# Patient Record
Sex: Female | Born: 2000 | Race: White | Hispanic: No | Marital: Single | Smoking: Never smoker
Health system: Southern US, Community
[De-identification: ages and names within clinical notes are randomized; demographics above are authoritative.]

## PROBLEM LIST (undated history)

## (undated) DIAGNOSIS — F411 Generalized anxiety disorder: Secondary | ICD-10-CM

## (undated) DIAGNOSIS — F329 Major depressive disorder, single episode, unspecified: Secondary | ICD-10-CM

## (undated) DIAGNOSIS — N390 Urinary tract infection, site not specified: Secondary | ICD-10-CM

## (undated) DIAGNOSIS — F913 Oppositional defiant disorder: Secondary | ICD-10-CM

## (undated) DIAGNOSIS — F32A Depression, unspecified: Secondary | ICD-10-CM

## (undated) DIAGNOSIS — F419 Anxiety disorder, unspecified: Secondary | ICD-10-CM

## (undated) HISTORY — DX: Anxiety disorder, unspecified: F41.9

## (undated) HISTORY — DX: Depression, unspecified: F32.A

## (undated) HISTORY — DX: Urinary tract infection, site not specified: N39.0

---
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
## 2022-01-20 ENCOUNTER — Telehealth: Payer: Self-pay

## 2022-01-20 ENCOUNTER — Other Ambulatory Visit: Payer: Self-pay | Admitting: Pediatrics

## 2022-01-20 MED ORDER — METHYLPHENIDATE HCL ER (OSM) 54 MG PO TBCR: 54.0000 mg | EXTENDED_RELEASE_TABLET | Freq: Every day | ORAL | 0 refills | Status: DC

## 2022-01-20 NOTE — Telephone Encounter (Signed)
Scheduled for 5-23

## 2022-01-20 NOTE — Telephone Encounter (Signed)
Parent called asking for refill of Concerta methylphenidate 54 MG PO CR tablet [facility] [place] [facility] of [address] ?

## 2022-01-20 NOTE — Telephone Encounter (Signed)
Overdue for visit- please schedule and then I'll send ?

## 2022-01-20 NOTE — Telephone Encounter (Signed)
Meds sent

## 2022-01-22 ENCOUNTER — Other Ambulatory Visit: Payer: Self-pay | Admitting: Pediatrics

## 2022-01-22 MED ORDER — METHYLPHENIDATE HCL ER (OSM) 54 MG PO TBCR: 54.0000 mg | EXTENDED_RELEASE_TABLET | Freq: Every day | ORAL | 0 refills | Status: DC

## 2022-01-22 NOTE — Telephone Encounter (Signed)
RX was sent to wrong pharmacy. [facility] [place] [facility] of [address]

## 2022-01-22 NOTE — Telephone Encounter (Signed)
Done

## 2022-01-27 ENCOUNTER — Encounter: Payer: Self-pay | Admitting: Pediatrics

## 2022-01-27 ENCOUNTER — Ambulatory Visit: Payer: Managed Care, Other (non HMO) | Admitting: Pediatrics

## 2022-01-27 VITALS — BP 110/70 | HR 74 | Ht 64.17 in | Wt 135.8 lb

## 2022-01-27 DIAGNOSIS — R634 Abnormal weight loss: Secondary | ICD-10-CM

## 2022-01-27 DIAGNOSIS — F411 Generalized anxiety disorder: Secondary | ICD-10-CM

## 2022-01-27 DIAGNOSIS — F900 Attention-deficit hyperactivity disorder, predominantly inattentive type: Secondary | ICD-10-CM | POA: Diagnosis not present

## 2022-01-27 DIAGNOSIS — F331 Major depressive disorder, recurrent, moderate: Secondary | ICD-10-CM

## 2022-01-27 DIAGNOSIS — N926 Irregular menstruation, unspecified: Secondary | ICD-10-CM

## 2022-01-27 MED ORDER — BUPROPION HCL ER (XL) 150 MG PO TB24: ORAL_TABLET | ORAL | 1 refills | Status: DC

## 2022-01-27 MED ORDER — METHYLPHENIDATE HCL ER (OSM) 54 MG PO TBCR: 54.0000 mg | EXTENDED_RELEASE_TABLET | Freq: Every day | ORAL | 0 refills | Status: DC

## 2022-01-27 MED ORDER — SERTRALINE HCL 100 MG PO TABS: 150.0000 mg | ORAL_TABLET | Freq: Every day | ORAL | 1 refills | Status: DC

## 2022-01-27 NOTE — Progress Notes (Signed)
History was provided by the patient.  [name] is a 21 y.o. female who is here for ADHD, GAD, MDD. Irregular periods.  Leveda Anna, NP   HPI:  Pt reports things have been good but "crazy." She is graduating high school and last day is June 1st. She is going to [employer] and is on their dance team. Majoring in psych and special ed. She got great scholarships.   Meds have been going well. She is taking the concerta every day.   Weight loss since 2021. 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   24 hour recall:  B: mini wheats  D: chicken and rice  L: none  B: smoothie  Doesn't eat lunch at school but never has. No dizziness or headaches   Not seeing therapist.   Currently on period. They have been somewhat more regular.      01/27/2022    9:45 AM 05/20/2021    5:24 PM 01/02/2021   11:28 AM  PHQ-SADS Last 3 Score only  PHQ-15 Score 2  5  Total GAD-7 Score 4  5  PHQ Adolescent Score 4 7 2      Patient's last menstrual period was 01/27/2022.   Patient Active Problem List   Diagnosis Date Noted   Irregular periods 05/09/2020   Attention deficit hyperactivity disorder (ADHD), predominantly inattentive type 07/19/2018   GAD (generalized anxiety disorder) 12/09/2017   Moderate episode of recurrent major depressive disorder ([place]) 07/15/2017   Encounter for routine child health examination without abnormal findings 11/09/2016   BMI (body mass index), pediatric, 5% to less than 85% for age 52/01/2017    Current Outpatient Medications on File Prior to Visit  Medication Sig Dispense Refill   omeprazole (PRILOSEC) 40 MG capsule Take 1 capsule (40 mg total) by mouth daily. 90 capsule 3   No current facility-administered medications on file prior to visit.    Allergies  Allergen Reactions   Amoxicillin Rash    Reaction at approx 21 years old   Penicillins Rash    Social History: Confidentiality was discussed with  the patient and if applicable, with caregiver as well. Tobacco: no, though used to vape occasionally Secondhand smoke exposure? no Drugs/EtOH: none Sexually active? No; interested mostly in men  Safety: safe to self and at home  Last STI Screening: today  Pregnancy Prevention: none  Physical Exam:    Vitals:   01/27/22 0909  BP: 110/70  Pulse: 74  Weight: 135 lb 12.8 oz (61.6 kg)  Height: 5' 4.17" (1.63 m)    Blood pressure percentiles are not available for patients who are 18 years or older.  Physical Exam Vitals and nursing note reviewed.  Constitutional:      General: She is not in acute distress.    Appearance: She is well-developed.  Neck:     Thyroid: No thyromegaly.  Cardiovascular:     Rate and Rhythm: Normal rate and regular rhythm.     Heart sounds: No murmur heard. Pulmonary:     Breath sounds: Normal breath sounds.  Abdominal:     Palpations: Abdomen is soft. There is no mass.     Tenderness: There is no abdominal tenderness. There is no guarding.  Musculoskeletal:     Right lower leg: No edema.     Left lower leg: No edema.  Lymphadenopathy:     Cervical: No cervical adenopathy.  Skin:    General: Skin  is warm.     Capillary Refill: Capillary refill takes less than 2 seconds.     Findings: No rash.  Neurological:     General: No focal deficit present.     Mental Status: She is alert.     Comments: No tremor  Psychiatric:        Mood and Affect: Mood normal.    Assessment/Plan: 1. Moderate episode of recurrent major depressive disorder (HCC) Continue wellbutrin and sertraline.  - buPROPion (WELLBUTRIN XL) 150 MG 24 hr tablet; TAKE 1 TABLET(150 MG) BY MOUTH DAILY  Dispense: 90 tablet; Refill: 1  2. GAD (generalized anxiety disorder) Continue sertraline. Well controlled.  - sertraline (ZOLOFT) 100 MG tablet; Take 1.5 tablets (150 mg total) by mouth daily.  Dispense: 135 tablet; Refill: 1  3. Attention deficit hyperactivity disorder (ADHD),  predominantly inattentive type Continue concerta. She has had issues finding it. We discussed changing to something else vs. Continuing and seeing if shortage improves- she elects to continue for now.  - methylphenidate 54 MG PO CR tablet; Take 1 tablet (54 mg total) by mouth daily with breakfast.  Dispense: 30 tablet; Refill: 0 - methylphenidate 54 MG PO CR tablet; Take 1 tablet (54 mg total) by mouth daily with breakfast.  Dispense: 30 tablet; Refill: 0 - methylphenidate 54 MG PO CR tablet; Take 1 tablet (54 mg total) by mouth daily with breakfast.  Dispense: 30 tablet; Refill: 0  4. Weight loss Has had some weight loss over time. Growth chart reveals she had weight gain during covid and was not particularly active at that point. She is now much more active and on her feet a lot for her job. She has always skipped lunch at school which we discussed. Denies intentional weight loss. I do not think she needs labs today as she is back on her curve where she used to be through childhood. Will continue to monitor.   5. Irregular periods Becoming more regular. Will monitor.   Return in 3 months   Jonathon Resides, FNP

## 2022-02-27 ENCOUNTER — Telehealth: Payer: Self-pay | Admitting: *Deleted

## 2022-02-27 ENCOUNTER — Other Ambulatory Visit: Payer: Self-pay | Admitting: Pediatrics

## 2022-02-27 DIAGNOSIS — F331 Major depressive disorder, recurrent, moderate: Secondary | ICD-10-CM

## 2022-02-27 DIAGNOSIS — F411 Generalized anxiety disorder: Secondary | ICD-10-CM

## 2022-02-27 NOTE — Telephone Encounter (Signed)
Mother LVM asking for refill of Methylphenidate.  [facility] [address] [facility] [place].  States she has checked with them and it is in stock at this pharmacy.

## 2022-03-02 ENCOUNTER — Other Ambulatory Visit: Payer: Self-pay | Admitting: Pediatrics

## 2022-03-08 IMAGING — CR DG ABDOMEN 1V
1 series · 1 of 1 positions shown · non-contrast
Comparison: 08/19/2015

CLINICAL DATA: Vomiting, diarrhea

EXAM:
ABDOMEN - 1 VIEW

[t abdomen supine]
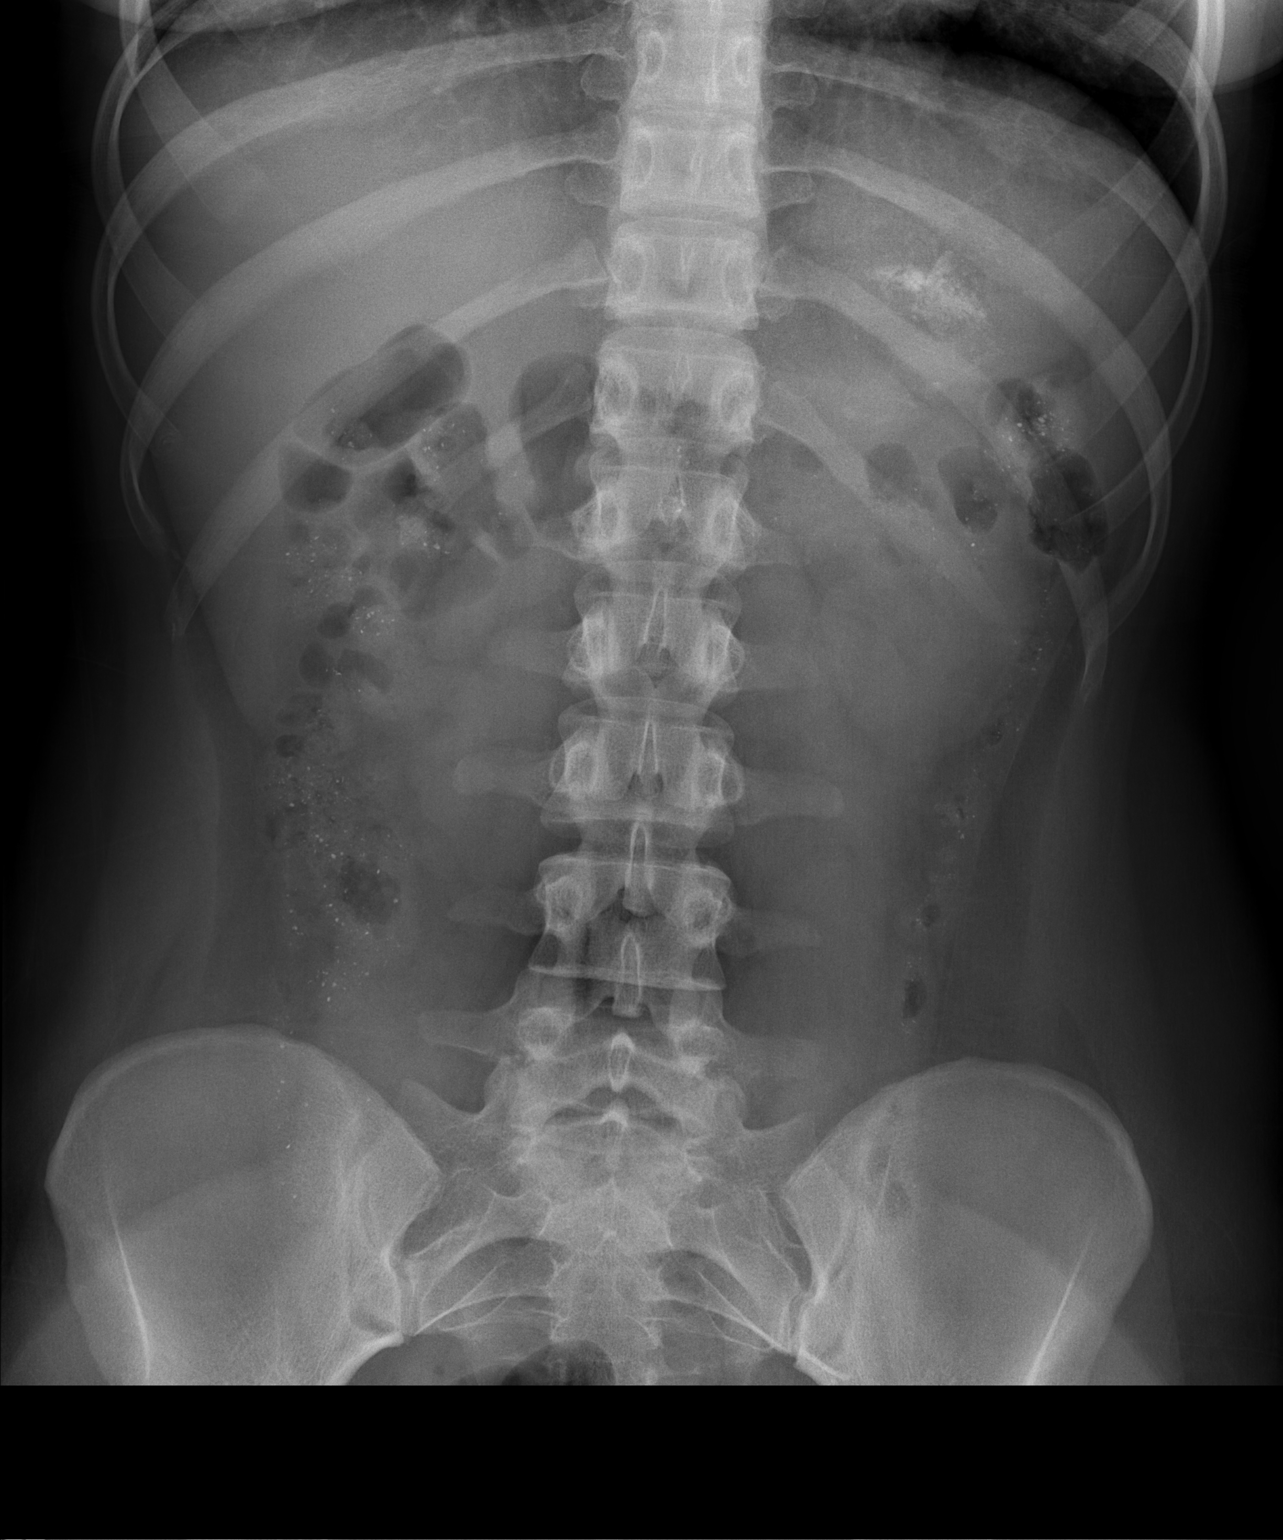

[1 of 1 positions shown; findings below may reference images not displayed]

FINDINGS: The bowel gas pattern is normal. No radio-opaque calculi or other
significant radiographic abnormality are seen.
IMPRESSION: Negative.

## 2022-04-13 ENCOUNTER — Other Ambulatory Visit: Payer: Self-pay | Admitting: Pediatrics

## 2022-04-20 ENCOUNTER — Encounter: Payer: Self-pay | Admitting: Pediatrics

## 2022-05-14 ENCOUNTER — Other Ambulatory Visit: Payer: Self-pay | Admitting: Family

## 2022-05-14 ENCOUNTER — Telehealth: Payer: Self-pay

## 2022-05-14 DIAGNOSIS — F900 Attention-deficit hyperactivity disorder, predominantly inattentive type: Secondary | ICD-10-CM

## 2022-05-14 MED ORDER — METHYLPHENIDATE HCL ER (OSM) 54 MG PO TBCR: 54.0000 mg | EXTENDED_RELEASE_TABLET | Freq: Every day | ORAL | 0 refills | Status: DC

## 2022-05-14 NOTE — Telephone Encounter (Signed)
Former pt of C. Maxwell Caul needs refill on Methylphenidate 54 mg. Pt is completely out of medication. She is now at college and requesting medication be sent to [facility] on [address] in [place], [place]. Please call mother with any questions.

## 2022-06-15 ENCOUNTER — Encounter: Payer: Self-pay | Admitting: Pediatrics

## 2022-06-15 ENCOUNTER — Ambulatory Visit (INDEPENDENT_AMBULATORY_CARE_PROVIDER_SITE_OTHER): Payer: Managed Care, Other (non HMO) | Admitting: Pediatrics

## 2022-06-15 VITALS — BP 102/80 | Ht 64.0 in | Wt 137.6 lb

## 2022-06-15 DIAGNOSIS — Z1331 Encounter for screening for depression: Secondary | ICD-10-CM | POA: Diagnosis not present

## 2022-06-15 DIAGNOSIS — F900 Attention-deficit hyperactivity disorder, predominantly inattentive type: Secondary | ICD-10-CM | POA: Diagnosis not present

## 2022-06-15 DIAGNOSIS — Z00129 Encounter for routine child health examination without abnormal findings: Secondary | ICD-10-CM

## 2022-06-15 DIAGNOSIS — Z79899 Other long term (current) drug therapy: Secondary | ICD-10-CM

## 2022-06-15 DIAGNOSIS — Z0001 Encounter for general adult medical examination with abnormal findings: Secondary | ICD-10-CM | POA: Diagnosis not present

## 2022-06-15 DIAGNOSIS — Z68.41 Body mass index (BMI) pediatric, 5th percentile to less than 85th percentile for age: Secondary | ICD-10-CM

## 2022-06-15 MED ORDER — METHYLPHENIDATE HCL ER (OSM) 54 MG PO TBCR: 54.0000 mg | EXTENDED_RELEASE_TABLET | Freq: Every day | ORAL | 0 refills | Status: DC

## 2022-06-15 MED ORDER — OMEPRAZOLE 40 MG PO CPDR: DELAYED_RELEASE_CAPSULE | ORAL | 3 refills | Status: DC

## 2022-06-15 NOTE — Patient Instructions (Signed)
At [facility] we value your feedback. You may receive a survey about your visit today. Please share your experience as we strive to create trusting relationships with our patients to provide genuine, compassionate, quality care.  Preventive Care 18-19 Years Old, Female Preventive care refers to lifestyle choices and visits with your health care provider that can promote health and wellness. At this stage in your life, you may start seeing a primary care physician instead of a pediatrician for your preventive care. Preventive care visits are also called wellness exams. What can I expect for my preventive care visit? Counseling During your preventive care visit, your health care provider may ask about your: Medical history, including: Past medical problems. Family medical history. Pregnancy history. Current health, including: Menstrual cycle. Method of birth control. Emotional well-being. Home life and relationship well-being. Sexual activity and sexual health. Lifestyle, including: Alcohol, nicotine or tobacco, and drug use. Access to firearms. Diet, exercise, and sleep habits. Sunscreen use. Motor vehicle safety. Physical exam Your health care provider may check your: Height and weight. These may be used to calculate your BMI (body mass index). BMI is a measurement that tells if you are at a healthy weight. Waist circumference. This measures the distance around your waistline. This measurement also tells if you are at a healthy weight and may help predict your risk of certain diseases, such as type 2 diabetes and high blood pressure. Heart rate and blood pressure. Body temperature. Skin for abnormal spots. Breasts. What immunizations do I need? Vaccines are usually given at various ages, according to a schedule. Your health care provider will recommend vaccines for you based on your age, medical history, and lifestyle or other factors, such as travel or where you work. What  tests do I need? Screening Your health care provider may recommend screening tests for certain conditions. This may include: Vision and hearing tests. Lipid and cholesterol levels. Pelvic exam and Pap test. Hepatitis B test. Hepatitis C test. HIV (human immunodeficiency virus) test. STI (sexually transmitted infection) testing, if you are at risk. Tuberculosis skin test if you have symptoms. BRCA-related cancer screening. This may be done if you have a family history of breast, ovarian, tubal, or peritoneal cancers. Talk with your health care provider about your test results, treatment options, and if necessary, the need for more tests. Follow these instructions at home: Eating and drinking Eat a healthy diet that includes fresh fruits and vegetables, whole grains, lean protein, and low-fat dairy products. Drink enough fluid to keep your urine pale yellow. Do not drink alcohol if: Your health care provider tells you not to drink. You are pregnant, may be pregnant, or are planning to become pregnant. You are under the legal drinking age. In the [place] the legal drinking age is 21. If you drink alcohol: Limit how much you have to 0-1 drink a day. Know how much alcohol is in your drink. In the [place] one drink equals one 12 oz bottle of beer (355 mL), one 5 oz glass of wine (148 mL), or one 1 oz glass of hard liquor (44 mL). Lifestyle Brush your teeth every morning and night with fluoride toothpaste. Floss one time each day. Exercise for at least 30 minutes 5 or more days of the week. Do not use any products that contain nicotine or tobacco. These products include cigarettes, chewing tobacco, and vaping devices, such as e-cigarettes. If you need help quitting, ask your health care provider. Do not use drugs. If you are sexually   active, practice safe sex. Use a condom or other form of protection to prevent STIs. If you do not wish to become pregnant, use a form of birth control. If you plan  to become pregnant, see your health care provider for a prepregnancy visit. Find healthy ways to manage stress, such as: Meditation, yoga, or listening to music. Journaling. Talking to a trusted person. Spending time with friends and family. Safety Always wear your seat belt while driving or riding in a vehicle. Do not drive: If you have been drinking alcohol. Do not ride with someone who has been drinking. When you are tired or distracted. While texting. If you have been using any mind-altering substances or drugs. Wear a helmet and other protective equipment during sports activities. If you have firearms in your house, make sure you follow all gun safety procedures. Seek help if you have been bullied, physically abused, or sexually abused. Use the internet responsibly to avoid dangers, such as online bullying and online sex predators. What's next? Go to your health care provider once a year for an annual wellness visit. Ask your health care provider how often you should have your eyes and teeth checked. Stay up to date on all vaccines. This information is not intended to replace advice given to you by your health care provider. Make sure you discuss any questions you have with your health care provider. Document Revised: 02/19/2021 Document Reviewed: 02/19/2021 Elsevier Patient Education  2023 Elsevier Inc.  

## 2022-06-15 NOTE — Progress Notes (Signed)
Subjective:     History was provided by the patient and mother. [name] was given time to discuss concerns with provider without mom in the room.  Confidentiality was discussed with the patient and, if applicable, with caregiver as well.  [name] is a 21 y.o. female who is here for this well-child visit.  Immunization History  Administered Date(s) Administered   DTaP 10/02/2003, 11/28/2003, 01/31/2004, 11/03/2004, 08/08/2008   HIB (PRP-OMP) 10/02/2003, 11/28/2003, 01/31/2004, 11/03/2004   HPV 9-valent 10/02/2014, 06/18/2015, 12/09/2015   Hepatitis A 08/12/2005, 06/08/2007   Hepatitis B Apr [birth year], 2003, 10/02/2003, 04/30/2004   IPV 10/02/2003, 11/28/2003, 04/30/2004, 08/08/2008   Influenza Nasal 06/08/2007, 06/13/2009, 06/12/2010, 08/11/2011, 07/12/2012   Influenza, Seasonal, Injecte, Preservative Fre 06/18/2015   Influenza,Quad,Nasal, Live 07/06/2013, 07/05/2014   Influenza,inj,Quad PF,6+ Mos 09/14/2016, 11/05/2017, 07/13/2018, 05/04/2019, 05/09/2020, 05/20/2021   MMR 08/05/2004, 08/08/2008   Meningococcal B, OMV 05/09/2020, 05/20/2021   Meningococcal Conjugate 10/02/2014, 05/09/2020   PFIZER(Purple Top)SARS-COV-2 Vaccination 11/23/2019, 12/18/2019   Pneumococcal Conjugate-13 10/02/2003, 11/28/2003, 11/03/2004, 01/30/2005   Tdap 10/02/2014   Varicella 08/05/2004, 08/08/2008   The following portions of the patient's history were reviewed and updated as appropriate: allergies, current medications, past family history, past medical history, past social history, past surgical history, and problem list.  Current Issues: Current concerns include none. Currently menstruating? yes; current menstrual pattern: regular every month without intermenstrual spotting Sexually active? no  Does patient snore? no   Review of Nutrition: Current diet: meats, vegetables, fruits, water, milk, occasional sweet drinks Balanced diet? yes  Social Screening:  Parental relations: good Sibling relations:  brothers: 1 brother Discipline concerns? no Concerns regarding behavior with peers? no School performance: doing well; no concerns Secondhand smoke exposure? no  Screening Questions: Risk factors for anemia: no Risk factors for vision problems: no Risk factors for hearing problems: no Risk factors for tuberculosis: no Risk factors for dyslipidemia: no Risk factors for sexually-transmitted infections: no Risk factors for alcohol/drug use:  no    Objective:     Vitals:   06/15/22 1015  BP: 102/80  Weight: 137 lb 9.6 oz (62.4 kg)  Height: $Remove'5\' 4"'[id number]$  (1.626 m)   Growth parameters are noted and are appropriate for age.  General:   alert, cooperative, appears stated age, and no distress  Gait:   normal  Skin:   normal  Oral cavity:   lips, mucosa, and tongue normal; teeth and gums normal  Eyes:   sclerae white, pupils equal and reactive, red reflex normal bilaterally  Ears:   normal bilaterally  Neck:   no adenopathy, no carotid bruit, no JVD, supple, symmetrical, trachea midline, and thyroid not enlarged, symmetric, no tenderness/mass/nodules  Lungs:  clear to auscultation bilaterally  Heart:   regular rate and rhythm, S1, S2 normal, no murmur, click, rub or gallop and normal apical impulse  Abdomen:  soft, non-tender; bowel sounds normal; no masses,  no organomegaly  GU:  exam deferred  Tanner Stage:   B5  Extremities:  extremities normal, atraumatic, no cyanosis or edema  Neuro:  normal without focal findings, mental status, speech normal, alert and oriented x3, PERLA, and reflexes normal and symmetric     Assessment:    Well adolescent.   Medication management  Plan:    1. Anticipatory guidance discussed. Specific topics reviewed: bicycle helmets, breast self-exam, drugs, ETOH, and tobacco, importance of regular dental care, importance of regular exercise, importance of varied diet, limit TV, media violence, minimize junk food, seat belts, and sex; STD and pregnancy  prevention.  2.  Weight management:  The patient was counseled regarding nutrition and physical activity.  3. Development: appropriate for age  76. Immunizations today: up to date. History of previous adverse reactions to immunizations? no  5. Follow-up visit in 1 year for next well child visit, or sooner as needed.  6. ADHD medications refilled.

## 2022-10-09 ENCOUNTER — Telehealth: Payer: Self-pay | Admitting: Pediatrics

## 2022-10-09 NOTE — Telephone Encounter (Signed)
CALL BACK NUMBER:  [phone]  MEDICATION(S): methylphenidate 54 MG PO CR tablet   [id number] [place] [place] [address]   ARE YOU CURRENTLY COMPLETELY OUT OF THE MEDICATION? :  yes  parents would like to pick up prescription before visiting patient in college. Pt used to see Mrs.Hacker.

## 2022-10-12 ENCOUNTER — Other Ambulatory Visit: Payer: Self-pay | Admitting: Family

## 2022-10-12 DIAGNOSIS — F900 Attention-deficit hyperactivity disorder, predominantly inattentive type: Secondary | ICD-10-CM

## 2022-10-12 DIAGNOSIS — F331 Major depressive disorder, recurrent, moderate: Secondary | ICD-10-CM

## 2022-10-12 MED ORDER — BUPROPION HCL ER (XL) 150 MG PO TB24: 150.0000 mg | ORAL_TABLET | Freq: Every day | ORAL | 0 refills | Status: DC

## 2022-10-12 MED ORDER — METHYLPHENIDATE HCL ER (OSM) 54 MG PO TBCR: 54.0000 mg | EXTENDED_RELEASE_TABLET | Freq: Every day | ORAL | 0 refills | Status: DC

## 2022-10-15 ENCOUNTER — Telehealth (INDEPENDENT_AMBULATORY_CARE_PROVIDER_SITE_OTHER): Payer: Managed Care, Other (non HMO) | Admitting: Family

## 2022-10-15 ENCOUNTER — Encounter: Payer: Self-pay | Admitting: Family

## 2022-10-15 DIAGNOSIS — F411 Generalized anxiety disorder: Secondary | ICD-10-CM | POA: Diagnosis not present

## 2022-10-15 DIAGNOSIS — F331 Major depressive disorder, recurrent, moderate: Secondary | ICD-10-CM | POA: Diagnosis not present

## 2022-10-15 DIAGNOSIS — N926 Irregular menstruation, unspecified: Secondary | ICD-10-CM

## 2022-10-15 DIAGNOSIS — F900 Attention-deficit hyperactivity disorder, predominantly inattentive type: Secondary | ICD-10-CM | POA: Diagnosis not present

## 2022-10-15 NOTE — Progress Notes (Signed)
THIS RECORD MAY CONTAIN CONFIDENTIAL INFORMATION THAT SHOULD NOT BE RELEASED WITHOUT REVIEW OF THE SERVICE PROVIDER.  Virtual Follow-Up Visit via Video Note  I connected with [name]  on 10/15/22 at  9:00 AM EST by a video enabled telemedicine application and verified that I am speaking with the correct person using two identifiers.   Patient/parent location: dorm, [place], television/film set location: remote [place]    I discussed the limitations of evaluation and management by telemedicine and the availability of in person appointments.  I discussed that the purpose of this telehealth visit is to provide medical care while limiting exposure to the novel coronavirus.  The patient expressed understanding and agreed to proceed.   [name] is a 22 y.o. female referred by No ref. provider found here today for follow-up of medication management for MDD, GAD, and ADHD.   History was provided by the patient.  Supervising Physician: Dr. Roselind Messier   Plan from Last Visit: 01/27/22 1. Moderate episode of recurrent major depressive disorder (HCC) Continue wellbutrin and sertraline.  - buPROPion (WELLBUTRIN XL) 150 MG 24 hr tablet; TAKE 1 TABLET(150 MG) BY MOUTH DAILY  Dispense: 90 tablet; Refill: 1   2. GAD (generalized anxiety disorder) Continue sertraline. Well controlled.  - sertraline (ZOLOFT) 100 MG tablet; Take 1.5 tablets (150 mg total) by mouth daily.  Dispense: 135 tablet; Refill: 1   3. Attention deficit hyperactivity disorder (ADHD), predominantly inattentive type Continue concerta. She has had issues finding it. We discussed changing to something else vs. Continuing and seeing if shortage improves- she elects to continue for now.  - methylphenidate 54 MG PO CR tablet; Take 1 tablet (54 mg total) by mouth daily with breakfast.  Dispense: 30 tablet; Refill: 0 - methylphenidate 54 MG PO CR tablet; Take 1 tablet (54 mg total) by mouth daily with breakfast.  Dispense: 30  tablet; Refill: 0 - methylphenidate 54 MG PO CR tablet; Take 1 tablet (54 mg total) by mouth daily with breakfast.  Dispense: 30 tablet; Refill: 0   4. Weight loss Has had some weight loss over time. Growth chart reveals she had weight gain during covid and was not particularly active at that point. She is now much more active and on her feet a lot for her job. She has always skipped lunch at school which we discussed. Denies intentional weight loss. I do not think she needs labs today as she is back on her curve where she used to be through childhood. Will continue to monitor.    5. Irregular periods Becoming more regular. Will monitor.    Return in 3 months   Chief Complaint: ADHD   History of Present Illness:  -things going well for both wellbutrin and sertraline for year  -taking methlyphenidate 54 mg daily - life saver -no birth control - LMP about 2 weeks ago, bleeds monthly  -not sexually active    ADHD Medication Side Effects: Sleep problems: no Loss of appetite: no Abdominal pain: no Headache: no Irritability: no Dizziness: no Heart Palpitations: no Tics: no   Allergies  Allergen Reactions   Amoxicillin Rash    Reaction at approx 22 years old   Penicillins Rash   Outpatient Medications Prior to Visit  Medication Sig Dispense Refill   buPROPion (WELLBUTRIN XL) 150 MG 24 hr tablet Take 1 tablet (150 mg total) by mouth daily. 90 tablet 0   methylphenidate 54 MG PO CR tablet Take 1 tablet (54 mg total) by mouth  daily with breakfast. 30 tablet 0   omeprazole (PRILOSEC) 40 MG capsule TAKE 1 CAPSULE(40 MG) BY MOUTH DAILY 90 capsule 3   sertraline (ZOLOFT) 100 MG tablet TAKE 1 AND 1/2 TABLETS(150 MG) BY MOUTH DAILY 135 tablet 1   No facility-administered medications prior to visit.     Patient Active Problem List   Diagnosis Date Noted   Irregular periods 05/09/2020   Attention deficit hyperactivity disorder (ADHD), predominantly inattentive type 07/19/2018   GAD  (generalized anxiety disorder) 12/09/2017   Moderate episode of recurrent major depressive disorder ([place]) 07/15/2017   Medication management 11/09/2016   BMI (body mass index), pediatric, 5% to less than 85% for age 23/01/2017   The following portions of the patient's history were reviewed and updated as appropriate: allergies, current medications, past family history, past medical history, past social history, past surgical history, and problem list.  Visual Observations/Objective:   General Appearance: Well nourished well developed, in no apparent distress.  Eyes: conjunctiva no swelling or erythema ENT/Mouth: hoarseness noted, No cough for duration of visit.  Neck: Supple  Respiratory: Respiratory effort normal, normal rate, no retractions or distress.   Cardio: Appears well-perfused, noncyanotic Musculoskeletal: no obvious deformity Skin: visible skin without rashes, ecchymosis, erythema Neuro: Awake and oriented X 3,  Psych:  normal affect, Insight and Judgment appropriate.    Assessment/Plan: 1. Attention deficit hyperactivity disorder (ADHD), predominantly inattentive type 2. Moderate episode of recurrent major depressive disorder ([place]) 3. GAD (generalized anxiety disorder) -stable with current regimen for sertraline, wellbutrin, and methylphenidate  -return q 3 months or sooner as needed   4. Irregular periods -periods have regulated; now bleeding monthly -no birth control, not sexually active  -discussed return precautions    [place] screenings:     10/15/2022    9:06 AM 06/15/2022    5:14 PM 01/27/2022    9:45 AM  PHQ-SADS Last 3 Score only  PHQ-15 Score 1  2  Total GAD-7 Score 5  4  PHQ Adolescent Score 5 5 4     ASRS Completed on 10/15/22 Part A:  3/6 Part B:  5/12   Screens discussed with patient and parent and adjustments to plan made accordingly.   I discussed the assessment and treatment plan with the patient and/or parent/guardian.  They were provided an  opportunity to ask questions and all were answered.  They agreed with the plan and demonstrated an understanding of the instructions. They were advised to call back or seek an in-person evaluation in the emergency room if the symptoms worsen or if the condition fails to improve as anticipated.   Follow-up:   3 months or sooner if needed    Parthenia Ames, NP    CC: Cristino Martes Rodman Pickle, NP, No ref. provider found

## 2023-01-12 ENCOUNTER — Other Ambulatory Visit: Payer: Self-pay | Admitting: Family

## 2023-01-12 ENCOUNTER — Telehealth: Payer: Self-pay

## 2023-01-12 DIAGNOSIS — F331 Major depressive disorder, recurrent, moderate: Secondary | ICD-10-CM

## 2023-01-12 DIAGNOSIS — F900 Attention-deficit hyperactivity disorder, predominantly inattentive type: Secondary | ICD-10-CM

## 2023-01-12 MED ORDER — METHYLPHENIDATE HCL ER (OSM) 54 MG PO TBCR: 54.0000 mg | EXTENDED_RELEASE_TABLET | Freq: Every day | ORAL | 0 refills | Status: DC

## 2023-01-12 NOTE — Telephone Encounter (Signed)
[name]'s mom called the refill line for a refill on her Methyphenidate.

## 2023-01-13 MED ORDER — BUPROPION HCL ER (XL) 150 MG PO TB24: 150.0000 mg | ORAL_TABLET | Freq: Every day | ORAL | 0 refills | Status: DC

## 2023-02-18 ENCOUNTER — Other Ambulatory Visit: Payer: Self-pay | Admitting: Family

## 2023-02-18 DIAGNOSIS — F900 Attention-deficit hyperactivity disorder, predominantly inattentive type: Secondary | ICD-10-CM

## 2023-02-18 MED ORDER — METHYLPHENIDATE HCL ER (OSM) 54 MG PO TBCR: 54.0000 mg | EXTENDED_RELEASE_TABLET | Freq: Every day | ORAL | 0 refills | Status: DC

## 2023-02-19 ENCOUNTER — Telehealth: Payer: Self-pay | Admitting: *Deleted

## 2023-02-19 NOTE — Telephone Encounter (Signed)
[name]'s mother request refill for sertraline. Going on vacation tomorrow.

## 2023-02-21 ENCOUNTER — Encounter: Payer: Self-pay | Admitting: Family

## 2023-02-23 ENCOUNTER — Other Ambulatory Visit: Payer: Self-pay | Admitting: Family

## 2023-02-23 DIAGNOSIS — F331 Major depressive disorder, recurrent, moderate: Secondary | ICD-10-CM

## 2023-02-23 DIAGNOSIS — F411 Generalized anxiety disorder: Secondary | ICD-10-CM

## 2023-02-23 MED ORDER — BUPROPION HCL ER (XL) 150 MG PO TB24
150.0000 mg | ORAL_TABLET | Freq: Every day | ORAL | 0 refills | Status: DC
Start: 2023-02-23 — End: 2023-04-26

## 2023-02-23 MED ORDER — SERTRALINE HCL 100 MG PO TABS
ORAL_TABLET | ORAL | 1 refills | Status: DC
Start: 2023-02-23 — End: 2023-08-19

## 2023-02-24 NOTE — Telephone Encounter (Signed)
Left message for [name]'s mother to call us with a pharmacy location for prescription refill.

## 2023-03-30 ENCOUNTER — Other Ambulatory Visit: Payer: Self-pay | Admitting: Family

## 2023-03-30 DIAGNOSIS — F900 Attention-deficit hyperactivity disorder, predominantly inattentive type: Secondary | ICD-10-CM

## 2023-03-31 MED ORDER — METHYLPHENIDATE HCL ER (OSM) 54 MG PO TBCR
54.0000 mg | EXTENDED_RELEASE_TABLET | Freq: Every day | ORAL | 0 refills | Status: DC
Start: 2023-03-31 — End: 2023-05-16

## 2023-04-07 ENCOUNTER — Telehealth (INDEPENDENT_AMBULATORY_CARE_PROVIDER_SITE_OTHER): Payer: Managed Care, Other (non HMO) | Admitting: Family

## 2023-04-07 ENCOUNTER — Encounter: Payer: Self-pay | Admitting: Family

## 2023-04-07 DIAGNOSIS — F900 Attention-deficit hyperactivity disorder, predominantly inattentive type: Secondary | ICD-10-CM

## 2023-04-07 DIAGNOSIS — N926 Irregular menstruation, unspecified: Secondary | ICD-10-CM

## 2023-04-07 DIAGNOSIS — F331 Major depressive disorder, recurrent, moderate: Secondary | ICD-10-CM

## 2023-04-07 DIAGNOSIS — F411 Generalized anxiety disorder: Secondary | ICD-10-CM

## 2023-04-07 MED ORDER — HYDROXYZINE HCL 10 MG PO TABS: 10.0000 mg | ORAL_TABLET | Freq: Three times a day (TID) | ORAL | 0 refills | Status: DC | PRN

## 2023-04-07 NOTE — Progress Notes (Signed)
THIS RECORD MAY CONTAIN CONFIDENTIAL INFORMATION THAT SHOULD NOT BE RELEASED WITHOUT REVIEW OF THE SERVICE PROVIDER.  Virtual Follow-Up Visit via Video Note  I connected with [name]   on 04/07/23 at  3:00 PM EDT by a video enabled telemedicine application and verified that I am speaking with the correct person using two identifiers.   Patient/parent location: [facility], [place]  Provider location: remote [place]    I discussed the limitations of evaluation and management by telemedicine and the availability of in person appointments.  I discussed that the purpose of this telehealth visit is to provide medical care while limiting exposure to the novel coronavirus.  The patient expressed understanding and agreed to proceed.   [name] is a 22 y.o. female referred by [name], NP here today for follow-up of MDD, GAD, ADHD, predominately inattentive type.    History was provided by the patient.  Supervising Physician: Dr. Theadore Nan   Plan from Last Visit:   1. Attention deficit hyperactivity disorder (ADHD), predominantly inattentive type 2. Moderate episode of recurrent major depressive disorder (HCC) 3. GAD (generalized anxiety disorder) -stable with current regimen for sertraline, wellbutrin, and methylphenidate  -return q 3 months or sooner as needed    4. Irregular periods -periods have regulated; now bleeding monthly -no birth control, not sexually active  -discussed return precautions   Chief Complaint: Due for follow up   History of Present Illness:  -LMP a week ago, cycling monthly, no birth control  -about to start second year at [employer]  -[place] at beach  -takes Concerta every day  -no SI/HI -mood is good; used hydroxyzine as needed for panic attacks/anxiety; has not had refill but would like to have for school year   ADHD Medication Side Effects: Sleep problems: no Loss of appetite: no Abdominal pain: no Headache: no Irritability:  no Dizziness: no Heart Palpitations: no Tics: no    Allergies  Allergen Reactions   Amoxicillin Rash    Reaction at approx 22 years old   Penicillins Rash   Outpatient Medications Prior to Visit  Medication Sig Dispense Refill   buPROPion (WELLBUTRIN XL) 150 MG 24 hr tablet Take 1 tablet (150 mg total) by mouth daily. 90 tablet 0   methylphenidate 54 MG PO CR tablet Take 1 tablet (54 mg total) by mouth daily with breakfast. 30 tablet 0   omeprazole (PRILOSEC) 40 MG capsule TAKE 1 CAPSULE(40 MG) BY MOUTH DAILY 90 capsule 3   sertraline (ZOLOFT) 100 MG tablet TAKE 1 AND 1/2 TABLETS(150 MG) BY MOUTH DAILY 135 tablet 1   No facility-administered medications prior to visit.     Patient Active Problem List   Diagnosis Date Noted   Irregular periods 05/09/2020   Attention deficit hyperactivity disorder (ADHD), predominantly inattentive type 07/19/2018   GAD (generalized anxiety disorder) 12/09/2017   Moderate episode of recurrent major depressive disorder (HCC) 07/15/2017   Medication management 11/09/2016   BMI (body mass index), pediatric, 5% to less than 85% for age 34/01/2017  The following portions of the patient's history were reviewed and updated as appropriate: allergies, current medications, past family history, past medical history, past social history, past surgical history, and problem list.  Visual Observations/Objective:   General Appearance: Well nourished well developed, in no apparent distress.  Eyes: conjunctiva no swelling or erythema ENT/Mouth: No hoarseness, No cough for duration of visit.  Neck: Supple  Respiratory: Respiratory effort normal, normal rate, no retractions or distress.  Cardio: Appears well-perfused, noncyanotic Musculoskeletal: no obvious deformity Skin: visible skin without rashes, ecchymosis, erythema Neuro: Awake and oriented X 3,  Psych:  normal affect, Insight and Judgment appropriate.    Assessment/Plan: 1. Moderate episode of  recurrent major depressive disorder (HCC) 2. GAD (generalized anxiety disorder) 3. Attention deficit hyperactivity disorder (ADHD), predominantly inattentive type  -stable on current regimen: wellbutrin XL 150 mg, sertraline 150 mg, and methylphenidate 54 mg; Rx sent for hydroxyzine 10 mg three times per day as needed for anxiety.   I discussed the assessment and treatment plan with the patient and/or parent/guardian.  They were provided an opportunity to ask questions and all were answered.  They agreed with the plan and demonstrated an understanding of the instructions. They were advised to call back or seek an in-person evaluation in the emergency room if the symptoms worsen or if the condition fails to improve as anticipated.   Follow-up:   3 months or sooner if needed    Georges Mouse, NP    CC: Klett, Pascal Lux, NP, Janene Harvey Pascal Lux, NP

## 2023-04-26 ENCOUNTER — Other Ambulatory Visit: Payer: Self-pay | Admitting: Family

## 2023-04-26 DIAGNOSIS — F331 Major depressive disorder, recurrent, moderate: Secondary | ICD-10-CM

## 2023-04-27 MED ORDER — BUPROPION HCL ER (XL) 150 MG PO TB24
150.0000 mg | ORAL_TABLET | Freq: Every day | ORAL | 0 refills | Status: DC
Start: 2023-04-27 — End: 2023-07-05

## 2023-04-30 ENCOUNTER — Other Ambulatory Visit: Payer: Self-pay | Admitting: Family

## 2023-05-03 MED ORDER — HYDROXYZINE HCL 10 MG PO TABS: ORAL_TABLET | ORAL | 0 refills | Status: DC

## 2023-05-16 ENCOUNTER — Other Ambulatory Visit: Payer: Self-pay | Admitting: Family

## 2023-05-16 DIAGNOSIS — F900 Attention-deficit hyperactivity disorder, predominantly inattentive type: Secondary | ICD-10-CM

## 2023-05-17 MED ORDER — METHYLPHENIDATE HCL ER (OSM) 54 MG PO TBCR
54.0000 mg | EXTENDED_RELEASE_TABLET | Freq: Every day | ORAL | 0 refills | Status: DC
Start: 2023-05-17 — End: 2023-07-05

## 2023-05-18 ENCOUNTER — Encounter: Payer: Self-pay | Admitting: Pediatrics

## 2023-07-05 ENCOUNTER — Other Ambulatory Visit: Payer: Self-pay | Admitting: Family

## 2023-07-05 DIAGNOSIS — F331 Major depressive disorder, recurrent, moderate: Secondary | ICD-10-CM

## 2023-07-05 DIAGNOSIS — F900 Attention-deficit hyperactivity disorder, predominantly inattentive type: Secondary | ICD-10-CM

## 2023-07-05 MED ORDER — BUPROPION HCL ER (XL) 150 MG PO TB24
150.0000 mg | ORAL_TABLET | Freq: Every day | ORAL | 0 refills | Status: DC
Start: 2023-07-05 — End: 2023-11-19

## 2023-07-05 MED ORDER — METHYLPHENIDATE HCL ER (OSM) 54 MG PO TBCR
54.0000 mg | EXTENDED_RELEASE_TABLET | Freq: Every day | ORAL | 0 refills | Status: DC
Start: 2023-07-05 — End: 2023-08-22

## 2023-08-06 ENCOUNTER — Other Ambulatory Visit: Payer: Self-pay | Admitting: Family

## 2023-08-19 ENCOUNTER — Other Ambulatory Visit: Payer: Self-pay | Admitting: Pediatrics

## 2023-08-19 ENCOUNTER — Other Ambulatory Visit: Payer: Self-pay | Admitting: Family

## 2023-08-19 DIAGNOSIS — F411 Generalized anxiety disorder: Secondary | ICD-10-CM

## 2023-08-22 ENCOUNTER — Other Ambulatory Visit: Payer: Self-pay | Admitting: Family

## 2023-08-22 DIAGNOSIS — F900 Attention-deficit hyperactivity disorder, predominantly inattentive type: Secondary | ICD-10-CM

## 2023-08-23 MED ORDER — METHYLPHENIDATE HCL ER (OSM) 54 MG PO TBCR: 54.0000 mg | EXTENDED_RELEASE_TABLET | Freq: Every day | ORAL | 0 refills | Status: DC

## 2023-09-19 ENCOUNTER — Other Ambulatory Visit: Payer: Self-pay | Admitting: Family

## 2023-09-19 ENCOUNTER — Telehealth: Payer: Managed Care, Other (non HMO) | Admitting: Family

## 2023-09-19 DIAGNOSIS — J029 Acute pharyngitis, unspecified: Secondary | ICD-10-CM | POA: Diagnosis not present

## 2023-09-19 DIAGNOSIS — J069 Acute upper respiratory infection, unspecified: Secondary | ICD-10-CM

## 2023-09-19 DIAGNOSIS — F900 Attention-deficit hyperactivity disorder, predominantly inattentive type: Secondary | ICD-10-CM

## 2023-09-19 MED ORDER — BENZONATATE 100 MG PO CAPS: 100.0000 mg | ORAL_CAPSULE | Freq: Three times a day (TID) | ORAL | 0 refills | Status: DC | PRN

## 2023-09-19 MED ORDER — FLUTICASONE PROPIONATE 50 MCG/ACT NA SUSP: 2.0000 | Freq: Every day | NASAL | 6 refills | Status: DC

## 2023-09-19 MED ORDER — AZITHROMYCIN 250 MG PO TABS: ORAL_TABLET | ORAL | 0 refills | Status: DC

## 2023-09-19 NOTE — Progress Notes (Signed)
 Virtual Visit Consent   [name], you are scheduled for a virtual visit with a [facility] provider today. Just as with appointments in the office, your consent must be obtained to participate. Your consent will be active for this visit and any virtual visit you may have with one of our providers in the next 365 days. If you have a MyChart account, a copy of this consent can be sent to you electronically.  As this is a virtual visit, video technology does not allow for your provider to perform a traditional examination. This may limit your provider's ability to fully assess your condition. If your provider identifies any concerns that need to be evaluated in person or the need to arrange testing (such as labs, EKG, etc.), we will make arrangements to do so. Although advances in technology are sophisticated, we cannot ensure that it will always work on either your end or our end. If the connection with a video visit is poor, the visit may have to be switched to a telephone visit. With either a video or telephone visit, we are not always able to ensure that we have a secure connection.  By engaging in this virtual visit, you consent to the provision of healthcare and authorize for your insurance to be billed (if applicable) for the services provided during this visit. Depending on your insurance coverage, you may receive a charge related to this service.  I need to obtain your verbal consent now. Are you willing to proceed with your visit today? [name] has provided verbal consent on 09/19/2023 for a virtual visit (video or telephone). Bari Learn, FNP  Date: 09/19/2023 11:31 AM  Virtual Visit via Video Note   I, Bari Learn, connected with  [name]  ([id number], Feb 03, 2001) on 09/19/23 at 11:45 AM EST by a video-enabled telemedicine application and verified that I am speaking with the correct person using two identifiers.  Location: Patient: Virtual Visit Location Patient:  Home Provider: Virtual Visit Location Provider: Home Office   I discussed the limitations of evaluation and management by telemedicine and the availability of in person appointments. The patient expressed understanding and agreed to proceed.    History of Present Illness: [name] is a 23 y.o. who identifies as a female who was assigned female at birth, and is being seen today for sinus congestion, cough, sore throat that started 4 days.SABRA  HPI: Sore Throat  This is a new problem. The current episode started in the past 7 days. The problem has been gradually worsening. There has been no fever. The pain is at a severity of 6/10. The pain is mild. Associated symptoms include congestion, coughing, ear pain, neck pain, swollen glands and trouble swallowing. Pertinent negatives include no shortness of breath. She has had no exposure to strep. She has tried acetaminophen  and NSAIDs for the symptoms. The treatment provided mild relief.    Problems:  Patient Active Problem List   Diagnosis Date Noted   Irregular periods 05/09/2020   Attention deficit hyperactivity disorder (ADHD), predominantly inattentive type 07/19/2018   GAD (generalized anxiety disorder) 12/09/2017   Moderate episode of recurrent major depressive disorder (HCC) 07/15/2017   Medication management 11/09/2016   BMI (body mass index), pediatric, 5% to less than 85% for age 62/01/2017    Allergies:  Allergies  Allergen Reactions   Amoxicillin Rash    Reaction at approx 23 years old   Penicillins Rash   Medications:  Current Outpatient Medications:  azithromycin  (ZITHROMAX ) 250 MG tablet, Take 500 mg once, then 250 mg for four days, Disp: 6 tablet, Rfl: 0   benzonatate  (TESSALON  PERLES) 100 MG capsule, Take 1 capsule (100 mg total) by mouth 3 (three) times daily as needed., Disp: 20 capsule, Rfl: 0   fluticasone  (FLONASE ) 50 MCG/ACT nasal spray, Place 2 sprays into both nostrils daily., Disp: 16 g, Rfl: 6   buPROPion   (WELLBUTRIN  XL) 150 MG 24 hr tablet, Take 1 tablet (150 mg total) by mouth daily., Disp: 90 tablet, Rfl: 0   hydrOXYzine  (ATARAX ) 10 MG tablet, TAKE 1 TABLET BY MOUTH 1 TIME AS NEEDED FOR ANXIETY, Disp: 90 tablet, Rfl: 0   methylphenidate  54 MG PO CR tablet, Take 1 tablet (54 mg total) by mouth daily with breakfast., Disp: 30 tablet, Rfl: 0   omeprazole  (PRILOSEC) 40 MG capsule, TAKE 1 CAPSULE(40 MG) BY MOUTH DAILY, Disp: 90 capsule, Rfl: 3   sertraline  (ZOLOFT ) 100 MG tablet, TAKE 1 AND 1/2 TABLETS(150 MG) BY MOUTH DAILY, Disp: 135 tablet, Rfl: 1  Observations/Objective: Patient is well-developed, well-nourished in no acute distress.  Resting comfortably  at home.  Head is normocephalic, atraumatic.  No labored breathing.  Speech is clear and coherent with logical content.  Patient is alert and oriented at baseline.  Nasal congestion, coarse nonproductive cough  Assessment and Plan: 1. Viral URI (Primary) - fluticasone  (FLONASE ) 50 MCG/ACT nasal spray; Place 2 sprays into both nostrils daily.  Dispense: 16 g; Refill: 6 - benzonatate  (TESSALON  PERLES) 100 MG capsule; Take 1 capsule (100 mg total) by mouth 3 (three) times daily as needed.  Dispense: 20 capsule; Refill: 0  2. Acute pharyngitis, unspecified etiology - azithromycin  (ZITHROMAX ) 250 MG tablet; Take 500 mg once, then 250 mg for four days  Dispense: 6 tablet; Refill: 0  - Take meds as prescribed - Use a cool mist humidifier  -Use saline nose sprays frequently -Force fluids -For any cough or congestion  Use plain Mucinex- regular strength or max strength is fine -For fever or aces or pains- take tylenol  or ibuprofen. -Throat lozenges if help -New toothbrush in 3 days - Pt will continue OTC medication at this time, if symptoms worsen or do not improve in the next few days she will start the Zpak  Follow Up Instructions: I discussed the assessment and treatment plan with the patient. The patient was provided an opportunity to  ask questions and all were answered. The patient agreed with the plan and demonstrated an understanding of the instructions.  A copy of instructions were sent to the patient via MyChart unless otherwise noted below.     The patient was advised to call back or seek an in-person evaluation if the symptoms worsen or if the condition fails to improve as anticipated.    Bari Learn, FNP

## 2023-09-20 MED ORDER — METHYLPHENIDATE HCL ER (OSM) 54 MG PO TBCR
54.0000 mg | EXTENDED_RELEASE_TABLET | Freq: Every day | ORAL | 0 refills | Status: DC
Start: 2023-09-20 — End: 2023-11-19

## 2023-11-02 ENCOUNTER — Other Ambulatory Visit: Payer: Self-pay | Admitting: Family

## 2023-11-02 DIAGNOSIS — F900 Attention-deficit hyperactivity disorder, predominantly inattentive type: Secondary | ICD-10-CM

## 2023-11-15 ENCOUNTER — Encounter: Payer: Self-pay | Admitting: Family

## 2023-11-19 ENCOUNTER — Telehealth: Admitting: Family

## 2023-11-19 ENCOUNTER — Telehealth: Payer: Self-pay | Admitting: Pediatrics

## 2023-11-19 ENCOUNTER — Encounter: Payer: Self-pay | Admitting: Family

## 2023-11-19 DIAGNOSIS — F331 Major depressive disorder, recurrent, moderate: Secondary | ICD-10-CM

## 2023-11-19 DIAGNOSIS — F411 Generalized anxiety disorder: Secondary | ICD-10-CM | POA: Diagnosis not present

## 2023-11-19 DIAGNOSIS — F900 Attention-deficit hyperactivity disorder, predominantly inattentive type: Secondary | ICD-10-CM

## 2023-11-19 MED ORDER — METHYLPHENIDATE HCL ER (OSM) 54 MG PO TBCR: 54.0000 mg | EXTENDED_RELEASE_TABLET | Freq: Every day | ORAL | 0 refills | Status: DC

## 2023-11-19 MED ORDER — BUPROPION HCL ER (XL) 150 MG PO TB24: 150.0000 mg | ORAL_TABLET | Freq: Every day | ORAL | 0 refills | Status: DC

## 2023-11-19 MED ORDER — SERTRALINE HCL 100 MG PO TABS: ORAL_TABLET | ORAL | 1 refills | Status: DC

## 2023-11-19 MED ORDER — HYDROXYZINE HCL 10 MG PO TABS: ORAL_TABLET | ORAL | 0 refills | Status: AC

## 2023-11-19 NOTE — Telephone Encounter (Addendum)
 Called patient and left message to return call regarding 30mo video or in person visit follow up with Bernell List.

## 2023-11-19 NOTE — Progress Notes (Signed)
 THIS RECORD MAY CONTAIN CONFIDENTIAL INFORMATION THAT SHOULD NOT BE RELEASED WITHOUT REVIEW OF THE SERVICE PROVIDER.  Virtual Follow-Up Visit via Video Note  I connected with [name]  on 11/19/23 at  9:30 AM EDT by a video enabled telemedicine application and verified that I am speaking with the correct person using two identifiers.   Patient/parent location: home Provider location: remote, [place]    I discussed the limitations of evaluation and management by telemedicine and the availability of in person appointments.  I discussed that the purpose of this telehealth visit is to provide medical care while limiting exposure to the novel coronavirus.  The patient expressed understanding and agreed to proceed.   [name] is a 23 y.o. female referred by [name], NP here today for follow-up of MDD, GAD, ADHD, predominately inattentive type.   History was provided by the patient.  Supervising Physician: Dr. Theadore Nan   Plan from Last Visit:   1. Moderate episode of recurrent major depressive disorder (HCC) 2. GAD (generalized anxiety disorder) 3. Attention deficit hyperactivity disorder (ADHD), predominantly inattentive type   -stable on current regimen: wellbutrin XL 150 mg, sertraline 150 mg, and methylphenidate 54 mg; Rx sent for hydroxyzine 10 mg three times per day as needed for anxiety.     Chief Complaint: Needs refills   History of Present Illness:  -taking medication usually in morning at 8AM   ADHD Medication Side Effects: Sleep problems: no Loss of appetite: no Abdominal pain: no Headache: no Irritability: no Dizziness: no Heart Palpitations: no Tics: no   LMP: monthly, 3 weeks ago; no significant changes in mood, only when she stops taking methylphenidate; roommates and friends told her they couldn't really tell she had ADHD until she wasn't on her Concerta and they noticed it.   Allergies  Allergen Reactions   Amoxicillin Rash    Reaction  at approx 23 years old   Penicillins Rash   Outpatient Medications Prior to Visit  Medication Sig Dispense Refill   azithromycin (ZITHROMAX) 250 MG tablet Take 500 mg once, then 250 mg for four days 6 tablet 0   benzonatate (TESSALON PERLES) 100 MG capsule Take 1 capsule (100 mg total) by mouth 3 (three) times daily as needed. 20 capsule 0   buPROPion (WELLBUTRIN XL) 150 MG 24 hr tablet Take 1 tablet (150 mg total) by mouth daily. 90 tablet 0   fluticasone (FLONASE) 50 MCG/ACT nasal spray Place 2 sprays into both nostrils daily. 16 g 6   hydrOXYzine (ATARAX) 10 MG tablet TAKE 1 TABLET BY MOUTH 1 TIME AS NEEDED FOR ANXIETY 90 tablet 0   methylphenidate 54 MG PO CR tablet Take 1 tablet (54 mg total) by mouth daily with breakfast. 30 tablet 0   omeprazole (PRILOSEC) 40 MG capsule TAKE 1 CAPSULE(40 MG) BY MOUTH DAILY 90 capsule 3   sertraline (ZOLOFT) 100 MG tablet TAKE 1 AND 1/2 TABLETS(150 MG) BY MOUTH DAILY 135 tablet 1   No facility-administered medications prior to visit.     Patient Active Problem List   Diagnosis Date Noted   Irregular periods 05/09/2020   Attention deficit hyperactivity disorder (ADHD), predominantly inattentive type 07/19/2018   GAD (generalized anxiety disorder) 12/09/2017   Moderate episode of recurrent major depressive disorder (HCC) 07/15/2017   Medication management 11/09/2016   BMI (body mass index), pediatric, 5% to less than 85% for age 79/01/2017   The following portions of the patient's history were reviewed and updated as appropriate:  allergies, current medications, past family history, past medical history, past social history, past surgical history, and problem list.  Visual Observations/Objective:   General Appearance: Well nourished well developed, in no apparent distress.  Eyes: conjunctiva no swelling or erythema ENT/Mouth: No hoarseness, No cough for duration of visit.  Neck: Supple  Respiratory: Respiratory effort normal, normal rate, no  retractions or distress.   Cardio: Appears well-perfused, noncyanotic Musculoskeletal: no obvious deformity Skin: visible skin without rashes, ecchymosis, erythema Neuro: Awake and oriented X 3,  Psych:  normal affect, Insight and Judgment appropriate.    Assessment/Plan:  Stable on current regimen; continue course  Return precautions reviewed  At next follow-up, repeat PHQSADS and ASRS   1. Moderate episode of recurrent major depressive disorder (HCC) (Primary) - buPROPion (WELLBUTRIN XL) 150 MG 24 hr tablet; Take 1 tablet (150 mg total) by mouth daily.  Dispense: 90 tablet; Refill: 0  2. Attention deficit hyperactivity disorder (ADHD), predominantly inattentive type - methylphenidate 54 MG PO CR tablet; Take 1 tablet (54 mg total) by mouth daily with breakfast.  Dispense: 30 tablet; Refill: 0  3. GAD (generalized anxiety disorder) - hydrOXYzine (ATARAX) 10 MG tablet; TAKE 1 TABLET BY MOUTH 1 TIME AS NEEDED FOR ANXIETY  Dispense: 90 tablet; Refill: 0 - sertraline (ZOLOFT) 100 MG tablet; TAKE 1 AND 1/2 TABLETS(150 MG) BY MOUTH DAILY  Dispense: 135 tablet; Refill: 1   I discussed the assessment and treatment plan with the patient and/or parent/guardian.  They were provided an opportunity to ask questions and all were answered.  They agreed with the plan and demonstrated an understanding of the instructions. They were advised to call back or seek an in-person evaluation in the emergency room if the symptoms worsen or if the condition fails to improve as anticipated.   Follow-up:   3 months or sooner if needed     Georges Mouse, NP    CC: Klett, Pascal Lux, NP, Janene Harvey Pascal Lux, NP

## 2023-12-28 ENCOUNTER — Other Ambulatory Visit: Payer: Self-pay | Admitting: Family

## 2023-12-28 DIAGNOSIS — F900 Attention-deficit hyperactivity disorder, predominantly inattentive type: Secondary | ICD-10-CM

## 2023-12-28 MED ORDER — METHYLPHENIDATE HCL ER (OSM) 54 MG PO TBCR: 54.0000 mg | EXTENDED_RELEASE_TABLET | Freq: Every day | ORAL | 0 refills | Status: DC

## 2024-02-18 ENCOUNTER — Telehealth: Payer: Self-pay

## 2024-02-18 NOTE — Telephone Encounter (Signed)
 Patient mom called to request refill on Methylphenidate  to [facility] in [place] on [address].

## 2024-02-24 ENCOUNTER — Other Ambulatory Visit: Payer: Self-pay | Admitting: Family

## 2024-02-24 DIAGNOSIS — F331 Major depressive disorder, recurrent, moderate: Secondary | ICD-10-CM

## 2024-02-25 ENCOUNTER — Telehealth (INDEPENDENT_AMBULATORY_CARE_PROVIDER_SITE_OTHER): Payer: Self-pay | Admitting: Family

## 2024-02-25 ENCOUNTER — Encounter: Payer: Self-pay | Admitting: Family

## 2024-02-25 DIAGNOSIS — F900 Attention-deficit hyperactivity disorder, predominantly inattentive type: Secondary | ICD-10-CM

## 2024-02-25 DIAGNOSIS — F411 Generalized anxiety disorder: Secondary | ICD-10-CM

## 2024-02-25 DIAGNOSIS — F331 Major depressive disorder, recurrent, moderate: Secondary | ICD-10-CM

## 2024-02-25 MED ORDER — SERTRALINE HCL 100 MG PO TABS
ORAL_TABLET | ORAL | 1 refills | Status: DC
Start: 2024-02-25 — End: 2024-04-12

## 2024-02-25 MED ORDER — METHYLPHENIDATE HCL ER (OSM) 54 MG PO TBCR
54.0000 mg | EXTENDED_RELEASE_TABLET | Freq: Every day | ORAL | 0 refills | Status: DC
Start: 2024-02-25 — End: 2024-04-12

## 2024-02-25 NOTE — Progress Notes (Signed)
 THIS RECORD MAY CONTAIN CONFIDENTIAL INFORMATION THAT SHOULD NOT BE RELEASED WITHOUT REVIEW OF THE SERVICE PROVIDER.  Virtual Follow-Up Visit via Video Note  I connected with [name]  on 02/25/24 at  9:00 AM EDT by a video enabled telemedicine application and verified that I am speaking with the correct person using two identifiers.   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  Provider location: remote, [place]    I discussed the limitations of evaluation and management by telemedicine and the availability of in person appointments.  I discussed that the purpose of this telehealth visit is to provide medical care while limiting exposure to the novel coronavirus.  The patient expressed understanding and agreed to proceed.   [name] is a 23 y.o. female referred by Rayann Cage, NP here today for follow-up of MDD, GAD and ADHD.   History was provided by the patient.  Supervising Physician: Dr. Lavonda Pour   Plan from Last Visit:   Stable on current regimen; continue course  Return precautions reviewed  At next follow-up, repeat PHQSADS and ASRS     1. Moderate episode of recurrent major depressive disorder (HCC) (Primary) - buPROPion  (WELLBUTRIN  XL) 150 MG 24 hr tablet; Take 1 tablet (150 mg total) by mouth daily.  Dispense: 90 tablet; Refill: 0   2. Attention deficit hyperactivity disorder (ADHD), predominantly inattentive type - methylphenidate  54 MG PO CR tablet; Take 1 tablet (54 mg total) by mouth daily with breakfast.  Dispense: 30 tablet; Refill: 0   3. 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 [employer]  -works about 70 hrs/week then going back to college in August  -feels medications are working well; has just been busy  -the sunburn and secondary infection from it have resolved   -LMP was last week, no period concerns  -safe to self, no SI/HI     02/25/2024    9:03 AM 10/15/2022    9:06 AM 06/15/2022    5:14 PM  PHQ-SADS Last 3 Score only  PHQ-15 Score 1 1   Total GAD-7 Score 5 5   PHQ Adolescent Score 2 5 5     Allergies  Allergen Reactions   Amoxicillin Rash    Reaction at approx 23 years old   Penicillins Rash   Outpatient Medications Prior to Visit  Medication Sig Dispense Refill   buPROPion  (WELLBUTRIN  XL) 150 MG 24 hr tablet TAKE 1 TABLET(150 MG) BY MOUTH DAILY 90 tablet 0   hydrOXYzine  (ATARAX ) 10 MG tablet TAKE 1 TABLET BY MOUTH 1 TIME AS NEEDED FOR ANXIETY 90 tablet 0   methylphenidate  54 MG PO CR tablet Take 1 tablet (54 mg total) by mouth daily with breakfast. 30 tablet 0   omeprazole  (PRILOSEC) 40 MG capsule TAKE 1 CAPSULE(40 MG) BY MOUTH DAILY 90 capsule 3   sertraline  (ZOLOFT ) 100 MG tablet TAKE 1 AND 1/2 TABLETS(150 MG) BY MOUTH DAILY 135 tablet 1  No facility-administered medications prior to visit.     Patient Active Problem List   Diagnosis Date Noted   Irregular periods 05/09/2020   Attention deficit hyperactivity disorder (ADHD), predominantly inattentive type 07/19/2018   GAD (generalized anxiety disorder) 12/09/2017   Moderate episode of recurrent major depressive disorder (HCC) 07/15/2017   Medication management 11/09/2016   BMI (body mass index), pediatric, 5% to less than 85% for age 35/01/2017  The following portions of the patient's history were reviewed and updated as appropriate: allergies, current medications, past family history, past medical history, past social  history, past surgical history, and problem list.  Visual Observations/Objective:   General Appearance: Well nourished well developed, in no apparent distress.  Eyes: conjunctiva no swelling or erythema ENT/Mouth: No hoarseness, No cough for duration of visit.  Neck: Supple  Respiratory: Respiratory effort normal, normal rate, no retractions or distress.   Cardio: Appears well-perfused, noncyanotic Musculoskeletal: no obvious deformity Skin: visible skin without rashes, ecchymosis, erythema Neuro: Awake and oriented X 3,  Psych:  normal affect, Insight and Judgment appropriate.    Assessment/Plan:  -she remains stable on this regimen and is due for in-person update with vitals/weight check; her schedule is busy however we discussed that she will need to return to clinic for RN visit to update chart as soon as possible; she is agreeable to this; we also briefly discussed need to establish adult PCP and eventually these medications to another provider.  Refills sent today as I do not want her to be without medications any longer than necessary; chart updated today to reflect screening tools as above. No charge for visit since out of state at time of encounter.   1. Moderate episode of recurrent major depressive disorder (HCC) (Primary) -wellbutrin  XL 150 mg refill sent yesterday, 90 day supply   2. Attention deficit hyperactivity disorder (ADHD), predominantly inattentive type - methylphenidate  54 MG PO CR tablet; Take 1 tablet (54 mg total) by mouth daily with breakfast.  Dispense: 30 tablet; Refill: 0  3. GAD (generalized anxiety disorder) - sertraline  (ZOLOFT ) 100 MG tablet; TAKE 1 AND 1/2 TABLETS(150 MG) BY MOUTH DAILY  Dispense: 135 tablet; Refill: 1   BH screenings:     02/25/2024    9:03 AM 10/15/2022    9:06 AM 06/15/2022    5:14 PM  PHQ-SADS Last 3 Score only  PHQ-15 Score 1 1   Total GAD-7 Score 5 5   PHQ Adolescent Score 2 5 5     Screens discussed with patient and  parent and adjustments to plan made accordingly.   I discussed the assessment and treatment plan with the patient and/or parent/guardian.  They were provided an opportunity to ask questions and all were answered.  They agreed with the plan and demonstrated an understanding of the instructions. They were advised to call back or seek an in-person evaluation in the emergency room if the symptoms worsen or if the condition fails to improve as anticipated.   Follow-up:   3 months or earlier as needed   Marijean Shouts, NP    CC: Klett, Freya Jesus, NP, Klett, Freya Jesus, NP

## 2024-04-12 ENCOUNTER — Ambulatory Visit: Admitting: Family

## 2024-04-12 ENCOUNTER — Other Ambulatory Visit (HOSPITAL_COMMUNITY)
Admission: RE | Admit: 2024-04-12 | Discharge: 2024-04-12 | Disposition: A | Discharge status: Home / Self Care | Source: Ambulatory Visit | Attending: Family | Admitting: Family

## 2024-04-12 ENCOUNTER — Encounter: Payer: Self-pay | Admitting: Family

## 2024-04-12 VITALS — BP 117/72 | HR 87 | Ht 64.57 in | Wt 156.0 lb

## 2024-04-12 DIAGNOSIS — F331 Major depressive disorder, recurrent, moderate: Secondary | ICD-10-CM | POA: Diagnosis not present

## 2024-04-12 DIAGNOSIS — F900 Attention-deficit hyperactivity disorder, predominantly inattentive type: Secondary | ICD-10-CM | POA: Diagnosis not present

## 2024-04-12 DIAGNOSIS — R42 Dizziness and giddiness: Secondary | ICD-10-CM

## 2024-04-12 DIAGNOSIS — E876 Hypokalemia: Secondary | ICD-10-CM

## 2024-04-12 DIAGNOSIS — Z789 Other specified health status: Secondary | ICD-10-CM | POA: Diagnosis not present

## 2024-04-12 DIAGNOSIS — Z113 Encounter for screening for infections with a predominantly sexual mode of transmission: Secondary | ICD-10-CM

## 2024-04-12 DIAGNOSIS — E559 Vitamin D deficiency, unspecified: Secondary | ICD-10-CM

## 2024-04-12 DIAGNOSIS — F411 Generalized anxiety disorder: Secondary | ICD-10-CM

## 2024-04-12 DIAGNOSIS — Z79899 Other long term (current) drug therapy: Secondary | ICD-10-CM

## 2024-04-12 DIAGNOSIS — Z3202 Encounter for pregnancy test, result negative: Secondary | ICD-10-CM | POA: Diagnosis not present

## 2024-04-12 DIAGNOSIS — Z8349 Family history of other endocrine, nutritional and metabolic diseases: Secondary | ICD-10-CM

## 2024-04-12 LAB — POCT URINE PREGNANCY: Preg Test, Ur: NEGATIVE

## 2024-04-12 MED ORDER — METHYLPHENIDATE HCL ER (OSM) 54 MG PO TBCR: 54.0000 mg | EXTENDED_RELEASE_TABLET | Freq: Every day | ORAL | 0 refills | Status: DC

## 2024-04-12 MED ORDER — OMEPRAZOLE 40 MG PO CPDR: DELAYED_RELEASE_CAPSULE | ORAL | 0 refills | Status: DC

## 2024-04-12 MED ORDER — BUPROPION HCL ER (XL) 150 MG PO TB24: 150.0000 mg | ORAL_TABLET | Freq: Every day | ORAL | 0 refills | Status: AC

## 2024-04-12 MED ORDER — SERTRALINE HCL 100 MG PO TABS: ORAL_TABLET | ORAL | 1 refills | Status: AC

## 2024-04-12 NOTE — Patient Instructions (Signed)
 Optometrists who accept Medicaid   Accepts Medicaid for Eye Exam and Glasses   [facility] [address] Phone: [phone]  Open Monday- Saturday from 9 AM to 5 PM Ages 6 months and older Se habla Espaol [facility] at [facility] [address] [place] Phone: [phone] Open Monday -Friday (by appointment only) Ages 63 and older No se habla Espaol   [facility] at [facility] [address] [place], [address] Phone: [phone] Open Monday-Saturday Ages 8 years and older Se habla Espaol  [facility] [place] [phone] [place], [place]  Phone: [phone] Open Monday-Friday Ages 5 years and older  Se habla Espaol   [facility] [place] [address] Phone: [phone] Open Monday-Friday Ages 5 and older No se habla Espaol  [facility] [place] [id number] Phone: [phone] Age 80 year old and older Open Monday-Saturday Se habla Espaol  [facility] at [facility] [address] Phone: [phone] Open Monday-Friday Ages 29 and older No se habla Espaol  [facility] [address], [place], [place] [address] Phone: [phone] Open Mon-Sat 10am-6pm Minimum age: 73 years No se habla [facility] [address] [place], [place] [address] Phone: [phone] Open Mon 1pm-7pm, Tue-Thur 8am-5:30pm, Fri 8am-1pm Minimum age: 68 years No se habla Espaol         Accepts Medicaid for Eye Exam only (will have to pay for glasses)   [facility] [address] Phone: [phone] Open 7 days per week Ages 5 and older (must know alphabet) No se habla Espaol  [facility] [place] [address]  Phone: [phone] Open 7 days per week Ages 1 and older (must know alphabet) No se habla [facility] [address] [place], [address] Phone: [phone] Open Monday-Saturday Ages 6 years and older Se habla Espaol  [facility] [address] [place] Phone: [phone] Open 7 days per week Ages 5 and older (must know alphabet) No se habla Espaol    Optometrists who do NOT accept Medicaid for Exam or Glasses [facility] [address] [place], [place] [address] Phone: [phone] Open Mon-Friday 8am-5pm Minimum age: 68 years No se habla [facility] [address] [place], [place], [place] [address] Phone: [phone] Open Mon-Thur 8am-5pm, Fri 8am-2pm Minimum age: 68 years No se habla [address] [facility] [address] [place], [place], [place] [address] Phone: [phone] Open Mon-Friday 10am-7pm, Sat 10am-4pm Minimum age: 68 years No se habla [facility] [address], [place], [place] [address] Phone: [phone] Open Mon-Thur 8am-5pm, Fri 8am-4pm Minimum age: 68 years No se habla [facility] [address] [place], [place] [address] Phone: [phone] Open Mon-Fri 9am-1pm Minimum age: 45 years No se habla Espaol

## 2024-04-12 NOTE — Progress Notes (Signed)
 History was provided by the patient.  [name] is a 23 y.o. female who is here for MDD, GAD, ADHD.   PCP confirmed? Yes.    Belenda Macario HERO, NP  Plan from last visit:  -she remains stable on this regimen and is due for in-person update with vitals/weight check; her schedule is busy however we discussed that she will need to return to clinic for RN visit to update chart as soon as possible; she is agreeable to this; we also briefly discussed need to establish adult PCP and eventually these medications to another provider.  Refills sent today as I do not want her to be without medications any longer than necessary; chart updated today to reflect screening tools as above. No charge for visit since out of state at time of encounter.    1. Moderate episode of recurrent major depressive disorder (HCC) (Primary) -wellbutrin  XL 150 mg refill sent yesterday, 90 day supply    2. Attention deficit hyperactivity disorder (ADHD), predominantly inattentive type - methylphenidate  54 MG PO CR tablet; Take 1 tablet (54 mg total) by mouth daily with breakfast.  Dispense: 30 tablet; Refill: 0   3. GAD (generalized anxiety disorder) - sertraline  (ZOLOFT ) 100 MG tablet; TAKE 1 AND 1/2 TABLETS(150 MG) BY MOUTH DAILY  Dispense: 135 tablet; Refill: 1     BH screenings:      02/25/2024    9:03 AM 10/15/2022    9:06 AM 06/15/2022    5:14 PM  PHQ-SADS Last 3 Score only  PHQ-15 Score 1 1    Total GAD-7 Score 5 5    PHQ Adolescent Score 2 5 5     Screens discussed with patient and parent and adjustments to plan made accordingly.    I discussed the assessment and treatment plan with the patient and/or parent/guardian.  They were provided an opportunity to ask questions and all were answered.  They agreed with the plan and demonstrated an understanding of the instructions. They were advised to call back or seek an in-person evaluation in the emergency room if the symptoms worsen or if the condition fails to  improve as anticipated.      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 -has been taking iron over the counter - not sure if she is anemic but has been having symptoms - mom has anemic  -vision changes during standing up; sometimes passes out -impetigo is gone (just some scars)  -feels like symptoms have been going on - noticeable in past year  -no nicotine vaping, no ETOH, no drug use  -no birth control; not sexually active   -stopped wearing corrective glasses; has reading glasses that she uses now; can see fine but has to strain   -vegetarian diet -eating mostly camp food (eggs breakfast, meat alt and veggies   -has been out of ADHD meds  -these symptoms are different than withdrawal symptoms when she misses sertraline  or wellbutrin       04/12/2024   11:58 AM 02/25/2024    9:03 AM 10/15/2022    9:06 AM  PHQ-SADS Last 3 Score only  PHQ-15 Score 2 1 1  Total GAD-7 Score 2 5 5   PHQ Adolescent Score 2 2 5    ASRS Completed on 04/12/24 Part A:  6/6 Part B:  7/12  Needs refil; has been out of ADHD medication  Also needs refill for Prilosec   Patient Active Problem List   Diagnosis Date Noted   Irregular periods 05/09/2020   Attention deficit hyperactivity disorder (ADHD), predominantly inattentive type 07/19/2018   GAD (generalized anxiety disorder) 12/09/2017   Moderate episode of recurrent major depressive disorder (HCC) 07/15/2017   Medication management 11/09/2016   BMI (body mass index), pediatric, 5% to less than 85% for age 67/01/2017    Current Outpatient Medications on File Prior to Visit  Medication Sig Dispense Refill   buPROPion  (WELLBUTRIN   XL) 150 MG 24 hr tablet TAKE 1 TABLET(150 MG) BY MOUTH DAILY 90 tablet 0   hydrOXYzine  (ATARAX ) 10 MG tablet TAKE 1 TABLET BY MOUTH 1 TIME AS NEEDED FOR ANXIETY 90 tablet 0   methylphenidate  54 MG PO CR tablet Take 1 tablet (54 mg total) by mouth daily with breakfast. 30 tablet 0   omeprazole  (PRILOSEC) 40 MG capsule TAKE 1 CAPSULE(40 MG) BY MOUTH DAILY 90 capsule 3   sertraline  (ZOLOFT ) 100 MG tablet TAKE 1 AND 1/2 TABLETS(150 MG) BY MOUTH DAILY 135 tablet 1   No current facility-administered medications on file prior to visit.    Allergies  Allergen Reactions   Amoxicillin Rash    Reaction at approx 23 years old   Penicillins Rash    Physical Exam:    Vitals:   04/12/24 1130  BP: 117/72  Pulse: 87  Weight: 156 lb (70.8 kg)  Height: 5' 4.57 (1.64 m)   Wt Readings from Last 3 Encounters:  04/12/24 156 lb (70.8 kg)  06/15/22 137 lb 9.6 oz (62.4 kg) (69%, Z= 0.50)*  01/27/22 135 lb 12.8 oz (61.6 kg) (68%, Z= 0.47)*   * Growth percentiles are based on CDC (Girls, 2-20 Years) data.    Growth %ile SmartLinks can only be used for patients less than 42 years old. No LMP recorded.  Physical Exam Constitutional:      General: She is not in acute distress.    Appearance: Normal appearance. She is well-developed.  HENT:     Head: Normocephalic and atraumatic.     Nose: Nose normal.     Mouth/Throat:     Mouth: Mucous membranes are moist.  Eyes:     General: No scleral icterus.    Pupils: Pupils are equal, round, and reactive to light.  Neck:     Thyroid : No thyromegaly.  Cardiovascular:     Rate and Rhythm: Normal rate and regular rhythm.     Heart sounds: Normal heart sounds. No murmur heard. Pulmonary:     Effort: Pulmonary effort is normal.     Breath sounds: Normal breath sounds.  Abdominal:     Palpations: Abdomen is soft.  Musculoskeletal:        General: Normal range of motion.     Cervical back: Normal range of motion and neck supple.  Lymphadenopathy:      Cervical: No cervical adenopathy.  Skin:    General: Skin is warm and dry.     Findings: No rash.  Neurological:     Mental Status: She is alert and oriented to person, place, and time.     Cranial Nerves: Cranial nerves 2-12 are intact. No cranial nerve deficit.     Motor: No tremor.  Psychiatric:  Attention and Perception: She is inattentive.        Mood and Affect: Mood normal.        Speech: Speech normal.        Behavior: Behavior normal.        Thought Content: Thought content normal.        Judgment: Judgment normal.      Assessment/Plan:   -labs today to assess for etiologies for orthostatic dizziness  -advised to go to optometrist for new Rx  -increase water intake  -due to vegetarian diet and low K noted in ED visit, will reassess CMP, CBCd, b12 and folate, vitamin D , A1c, lipids, and thyroid  -follow up pending lab results  -ASRS + has not been on stimulant; Rx sent today  -continue with current regimen  -refill for Prilosec sent today per request    1. Routine screening for STI (sexually transmitted infection) (Primary) - Urine cytology ancillary only  2. Pregnancy examination or test, negative result - POCT urine pregnancy  3. Orthostatic lightheadedness - B12 and Folate Panel - CBC with Differential/Platelet - Comprehensive metabolic panel with GFR - Hemoglobin A1c - Lipid panel - Thyroid  Panel With TSH - buPROPion  (WELLBUTRIN  XL) 150 MG 24 hr tablet; Take 1 tablet (150 mg total) by mouth daily.  Dispense: 90 tablet; Refill: 0  4. Low serum potassium - Comprehensive metabolic panel with GFR - Lipid panel - Thyroid  Panel With TSH  5. Family history of thyroid  disease in father - Lipid panel - Thyroid  Panel With TSH  6. Vitamin D  deficiency - VITAMIN D  25 Hydroxy (Vit-D Deficiency, Fractures)  7. GAD (generalized anxiety disorder) - Ambulatory referral to Psychiatry - sertraline  (ZOLOFT ) 100 MG tablet; TAKE 1 AND 1/2 TABLETS(150 MG) BY  MOUTH DAILY  Dispense: 135 tablet; Refill: 1  8. Moderate episode of recurrent major depressive disorder (HCC) - Ambulatory referral to Psychiatry - buPROPion  (WELLBUTRIN  XL) 150 MG 24 hr tablet; Take 1 tablet (150 mg total) by mouth daily.  Dispense: 90 tablet; Refill: 0  9. Medication management - Ambulatory referral to Psychiatry  10. Attention deficit hyperactivity disorder (ADHD), predominantly inattentive type - Ambulatory referral to Psychiatry - methylphenidate  54 MG PO CR tablet; Take 1 tablet (54 mg total) by mouth daily with breakfast.  Dispense: 30 tablet; Refill: 0  11. Vegetarian diet - B12 and Folate Panel

## 2024-04-13 LAB — CBC WITH DIFFERENTIAL/PLATELET
Absolute Lymphocytes: 2284 {cells}/uL (ref 850–3900)
Absolute Monocytes: 356 {cells}/uL (ref 200–950)
Basophils Absolute: 73 {cells}/uL (ref 0–200)
Basophils Relative: 1.1 %
Eosinophils Absolute: 112 {cells}/uL (ref 15–500)
Eosinophils Relative: 1.7 %
HCT: 39.7 % (ref 35.0–45.0)
Hemoglobin: 12.7 g/dL (ref 11.7–15.5)
MCH: 26.2 pg — ABNORMAL LOW (ref 27.0–33.0)
MCHC: 32 g/dL (ref 32.0–36.0)
MCV: 81.9 fL (ref 80.0–100.0)
MPV: 10.7 fL (ref 7.5–12.5)
Monocytes Relative: 5.4 %
Neutro Abs: 3775 {cells}/uL (ref 1500–7800)
Neutrophils Relative %: 57.2 %
Platelets: 329 Thousand/uL (ref 140–400)
RBC: 4.85 Million/uL (ref 3.80–5.10)
RDW: 14.1 % (ref 11.0–15.0)
Total Lymphocyte: 34.6 %
WBC: 6.6 Thousand/uL (ref 3.8–10.8)

## 2024-04-13 LAB — COMPREHENSIVE METABOLIC PANEL WITH GFR
AG Ratio: 1.8 (calc) (ref 1.0–2.5)
ALT: 13 U/L (ref 6–29)
AST: 16 U/L (ref 10–30)
Albumin: 4.6 g/dL (ref 3.6–5.1)
Alkaline phosphatase (APISO): 51 U/L (ref 31–125)
BUN: 8 mg/dL (ref 7–25)
CO2: 26 mmol/L (ref 20–32)
Calcium: 9.6 mg/dL (ref 8.6–10.2)
Chloride: 106 mmol/L (ref 98–110)
Creat: 0.72 mg/dL (ref 0.50–0.96)
Globulin: 2.6 g/dL (ref 1.9–3.7)
Glucose, Bld: 88 mg/dL (ref 65–99)
Potassium: 4.2 mmol/L (ref 3.5–5.3)
Sodium: 141 mmol/L (ref 135–146)
Total Bilirubin: 0.2 mg/dL (ref 0.2–1.2)
Total Protein: 7.2 g/dL (ref 6.1–8.1)
eGFR: 123 mL/min/1.73m2 (ref >=60)

## 2024-04-13 LAB — B12 AND FOLATE PANEL
Folate: 12.5 ng/mL
Vitamin B-12: 361 pg/mL (ref 200–1100)

## 2024-04-13 LAB — URINE CYTOLOGY ANCILLARY ONLY
Chlamydia: NEGATIVE
Comment: NEGATIVE
Comment: NEGATIVE
Comment: NORMAL
Neisseria Gonorrhea: NEGATIVE
Trichomonas: NEGATIVE

## 2024-04-13 LAB — LIPID PANEL
Cholesterol: 180 mg/dL (ref <200)
HDL: 38 mg/dL — ABNORMAL LOW (ref >=50)
LDL Cholesterol (Calc): 110 mg/dL — ABNORMAL HIGH
Non-HDL Cholesterol (Calc): 142 mg/dL — ABNORMAL HIGH (ref <130)
Total CHOL/HDL Ratio: 4.7 (calc) (ref <5.0)
Triglycerides: 208 mg/dL — ABNORMAL HIGH (ref <150)

## 2024-04-13 LAB — HEMOGLOBIN A1C
Hgb A1c MFr Bld: 5.4 % (ref <5.7)
Mean Plasma Glucose: 108 mg/dL
eAG (mmol/L): 6 mmol/L

## 2024-04-13 LAB — THYROID PANEL WITH TSH
Free Thyroxine Index: 2.2 (ref 1.4–3.8)
T4, Total: 7.7 ug/dL (ref 5.3–11.7)
TSH: 1.87 m[IU]/L
TSH: 28 m[IU]/L (ref 22–35)

## 2024-04-13 LAB — VITAMIN D 25 HYDROXY (VIT D DEFICIENCY, FRACTURES): Vit D, 25-Hydroxy: 72 ng/mL (ref 30–100)

## 2024-04-14 ENCOUNTER — Ambulatory Visit: Payer: Self-pay | Admitting: Family

## 2024-06-20 ENCOUNTER — Telehealth: Payer: Self-pay

## 2024-06-20 ENCOUNTER — Other Ambulatory Visit: Payer: Self-pay | Admitting: Family

## 2024-06-20 DIAGNOSIS — F900 Attention-deficit hyperactivity disorder, predominantly inattentive type: Secondary | ICD-10-CM

## 2024-06-20 MED ORDER — METHYLPHENIDATE HCL ER (OSM) 54 MG PO TBCR: 54.0000 mg | EXTENDED_RELEASE_TABLET | Freq: Every day | ORAL | 0 refills | Status: DC

## 2024-06-20 NOTE — Telephone Encounter (Signed)
 Patient's mom requesting refill on Methylphenidate  54 mg. She is requesting [facility] to [facility] [address] in [place] as patient is in town from college break. She states there are no refills available at her usual pharmacy at [facility].

## 2024-07-14 ENCOUNTER — Encounter: Payer: Self-pay | Admitting: Family

## 2024-07-14 ENCOUNTER — Telehealth (INDEPENDENT_AMBULATORY_CARE_PROVIDER_SITE_OTHER): Admitting: Family

## 2024-07-14 DIAGNOSIS — F900 Attention-deficit hyperactivity disorder, predominantly inattentive type: Secondary | ICD-10-CM

## 2024-07-14 NOTE — Progress Notes (Signed)
 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 Will plan to reschedule.  Patient was referred to [facility] for med management (prefers female providers) on 04/12/2024. Will route chart to RN to see if this has been completed.

## 2024-07-20 ENCOUNTER — Other Ambulatory Visit: Payer: Self-pay | Admitting: Family

## 2024-08-07 ENCOUNTER — Telehealth: Payer: Self-pay | Admitting: *Deleted

## 2024-08-07 ENCOUNTER — Telehealth: Payer: Self-pay

## 2024-08-07 ENCOUNTER — Other Ambulatory Visit: Payer: Self-pay | Admitting: Family

## 2024-08-07 DIAGNOSIS — F900 Attention-deficit hyperactivity disorder, predominantly inattentive type: Secondary | ICD-10-CM

## 2024-08-07 MED ORDER — METHYLPHENIDATE HCL ER (OSM) 54 MG PO TBCR: 54.0000 mg | EXTENDED_RELEASE_TABLET | Freq: Every day | ORAL | 0 refills | Status: AC

## 2024-08-07 NOTE — Telephone Encounter (Signed)
 Parent Luke Puma calling for refill on Methylphenidate , states patient is completely out and in middle of college exams this week. She also states that patient missed last phone appointment due to a college trip.   Parent is requesting refill at [facility] on [address] [place].  Mom requests call back on status, please advise RN.

## 2024-08-07 NOTE — Telephone Encounter (Signed)
 Charmeka's mother left message on refill line today at 3:51 pm for refill of Methylphenidate   to [facility] in [place] (where Arelis is in college).

## 2024-08-07 NOTE — Telephone Encounter (Signed)
 Opened in error

## 2024-09-08 ENCOUNTER — Other Ambulatory Visit: Payer: Self-pay | Admitting: Family

## 2024-09-08 DIAGNOSIS — F331 Major depressive disorder, recurrent, moderate: Secondary | ICD-10-CM

## 2024-09-08 DIAGNOSIS — R42 Dizziness and giddiness: Secondary | ICD-10-CM
